# Patient Record
Sex: Male | Born: 1965 | Race: White | Hispanic: Yes | Marital: Single | Smoking: Never smoker
Health system: Southern US, Community
[De-identification: ages and names within clinical notes are randomized; demographics above are authoritative.]

## PROBLEM LIST (undated history)

## (undated) DIAGNOSIS — K219 Gastro-esophageal reflux disease without esophagitis: Secondary | ICD-10-CM

---

## 2003-08-22 ENCOUNTER — Other Ambulatory Visit: Payer: Self-pay

## 2003-10-05 ENCOUNTER — Other Ambulatory Visit: Payer: Self-pay

## 2005-07-18 ENCOUNTER — Emergency Department: Payer: Self-pay | Admitting: Emergency Medicine

## 2005-09-16 ENCOUNTER — Emergency Department: Payer: Self-pay | Admitting: Emergency Medicine

## 2008-10-05 ENCOUNTER — Emergency Department: Payer: Self-pay | Admitting: Emergency Medicine

## 2008-10-18 ENCOUNTER — Emergency Department: Payer: Self-pay | Admitting: Emergency Medicine

## 2011-05-14 ENCOUNTER — Emergency Department: Payer: Self-pay | Admitting: Unknown Physician Specialty

## 2011-10-04 ENCOUNTER — Emergency Department: Payer: Self-pay | Admitting: Emergency Medicine

## 2011-10-04 LAB — URINALYSIS, COMPLETE
Bacteria: NONE SEEN
Bilirubin,UR: NEGATIVE
Blood: NEGATIVE
Glucose,UR: NEGATIVE mg/dL (ref 0–75)
Ketone: NEGATIVE
Nitrite: NEGATIVE
Ph: 6 (ref 4.5–8.0)
Protein: NEGATIVE
RBC,UR: <1 /HPF (ref 0–5)
Specific Gravity: 1.008 (ref 1.003–1.030)
Squamous Epithelial: <1
WBC UR: 3 /HPF (ref 0–5)
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
## 2013-07-08 ENCOUNTER — Emergency Department: Payer: Self-pay | Admitting: Emergency Medicine

## 2015-12-26 ENCOUNTER — Emergency Department (HOSPITAL_COMMUNITY)
Admission: EM | Admit: 2015-12-26 | Discharge: 2015-12-26 | Disposition: A | Discharge status: Home / Self Care | Payer: Self-pay | Attending: Emergency Medicine | Admitting: Emergency Medicine

## 2015-12-26 ENCOUNTER — Emergency Department (HOSPITAL_COMMUNITY): Payer: Self-pay

## 2015-12-26 ENCOUNTER — Encounter (HOSPITAL_COMMUNITY): Payer: Self-pay | Admitting: Emergency Medicine

## 2015-12-26 DIAGNOSIS — K2920 Alcoholic gastritis without bleeding: Secondary | ICD-10-CM | POA: Insufficient documentation

## 2015-12-26 DIAGNOSIS — Z79899 Other long term (current) drug therapy: Secondary | ICD-10-CM | POA: Insufficient documentation

## 2015-12-26 DIAGNOSIS — K920 Hematemesis: Secondary | ICD-10-CM | POA: Insufficient documentation

## 2015-12-26 LAB — CBC WITH DIFFERENTIAL/PLATELET
BASOS ABS: 0 10*3/uL (ref 0.0–0.1)
BASOS PCT: 0 %
EOS ABS: 0.2 10*3/uL (ref 0.0–0.7)
EOS PCT: 3 %
HCT: 37.5 % — ABNORMAL LOW (ref 39.0–52.0)
Hemoglobin: 12.6 g/dL — ABNORMAL LOW (ref 13.0–17.0)
Lymphocytes Relative: 39 %
Lymphs Abs: 2.3 10*3/uL (ref 0.7–4.0)
MCH: 29.7 pg (ref 26.0–34.0)
MCHC: 33.6 g/dL (ref 30.0–36.0)
MCV: 88.4 fL (ref 78.0–100.0)
MONO ABS: 0.2 10*3/uL (ref 0.1–1.0)
MONOS PCT: 3 %
NEUTROS ABS: 3.3 10*3/uL (ref 1.7–7.7)
Neutrophils Relative %: 55 %
PLATELETS: 216 10*3/uL (ref 150–400)
RBC: 4.24 MIL/uL (ref 4.22–5.81)
RDW: 13.2 % (ref 11.5–15.5)
WBC: 5.9 10*3/uL (ref 4.0–10.5)

## 2015-12-26 LAB — COMPREHENSIVE METABOLIC PANEL
ALBUMIN: 3.6 g/dL (ref 3.5–5.0)
ALT: 17 U/L (ref 17–63)
ANION GAP: 9 (ref 5–15)
AST: 21 U/L (ref 15–41)
Alkaline Phosphatase: 93 U/L (ref 38–126)
BILIRUBIN TOTAL: 0.4 mg/dL (ref 0.3–1.2)
BUN: 8 mg/dL (ref 6–20)
CHLORIDE: 113 mmol/L — AB (ref 101–111)
CO2: 20 mmol/L — ABNORMAL LOW (ref 22–32)
Calcium: 8.4 mg/dL — ABNORMAL LOW (ref 8.9–10.3)
Creatinine, Ser: 0.83 mg/dL (ref 0.61–1.24)
GFR calc Af Amer: >60 mL/min (ref >60)
GFR calc non Af Amer: >60 mL/min (ref >60)
GLUCOSE: 103 mg/dL — AB (ref 65–99)
POTASSIUM: 3.7 mmol/L (ref 3.5–5.1)
SODIUM: 142 mmol/L (ref 135–145)
TOTAL PROTEIN: 6.4 g/dL — AB (ref 6.5–8.1)

## 2015-12-26 LAB — LIPASE, BLOOD: Lipase: 25 U/L (ref 11–51)

## 2015-12-26 MED ORDER — SUCRALFATE 1 G PO TABS: 1.0000 g | ORAL_TABLET | Freq: Three times a day (TID) | ORAL | 0 refills | Status: AC

## 2015-12-26 MED ORDER — GI COCKTAIL ~~LOC~~
30.0000 mL | Freq: Once | ORAL | Status: AC
  Administered 2015-12-26: 30 mL via ORAL
  Filled 2015-12-26: qty 30

## 2015-12-26 MED ORDER — OMEPRAZOLE 20 MG PO CPDR: 20.0000 mg | DELAYED_RELEASE_CAPSULE | Freq: Every day | ORAL | 0 refills | Status: AC

## 2015-12-26 NOTE — ED Triage Notes (Signed)
Pt arrives via EMS from home with substernal chest pain that began approx before calling 911. Pt reports 1 episode of yellow emesis possible blood noted. Pt has been drinking today, admits to drinking 24 coronas over the last 24hours. States no PCP. Unknown medical hx. CBG 101. EKG NSR in transport. PT awake, alert, oriented x3, disoriented to time. VSS.

## 2015-12-26 NOTE — Discharge Instructions (Signed)
You were seen and evaluated today after vomiting blood. This is likely secondary to irritation of the lining of your stomach secondary to heavy alcohol drinking.  Please, follow up with a doctor outpatient.  Return for sudden uncontrolled vomiting or vomiting large quantities of blood.

## 2015-12-26 NOTE — ED Notes (Signed)
Spanish video interpreter used for triage and initial assessment. [employer] interpreter Zaida ID [phone]

## 2015-12-26 NOTE — ED Notes (Signed)
Discharge instructions reviewed with patient by Spanish interpreter Sherilyn Cooter ID# [id number]

## 2015-12-26 NOTE — ED Notes (Signed)
Pt passed PO challenge test.  He was able to eat crackers and drink without any trouble.

## 2015-12-26 NOTE — ED Provider Notes (Signed)
[facility]-EMERGENCY DEPT Provider Note   CSN: [id number] Arrival date & time: 12/26/15  1478  First Provider Contact:  First MD Initiated Contact with Patient 12/26/15 1914        History   Chief Complaint Chief Complaint  Patient presents with  . Chest Pain    HPI Larry Woods is a 50 y.o. male.  50 year old male with history of alcoholism presents for vomiting. The patient states that he's drank about 24 coronal is in the last 24 hours. Reports drinking 12 Corona's today. He says that he had an episode of vomiting that had a small amount of blood he believes in it. He denies this ever happening before. Reports some pain in his upper abdomen. Only had one episode of vomiting with blood in it. He reports that the blood is what made him call EMS.  History was taken using translator system Musician name:Julian).      History reviewed. No pertinent past medical history.  There are no active problems to display for this patient.   History reviewed. No pertinent surgical history.     Home Medications    Prior to Admission medications   Medication Sig Start Date End Date Taking? Authorizing Provider  omeprazole (PRILOSEC) 20 MG capsule Take 1 capsule (20 mg total) by mouth daily. 12/26/15   Leta Baptist, MD  sucralfate (CARAFATE) 1 g tablet Take 1 tablet (1 g total) by mouth 4 (four) times daily -  with meals and at bedtime. 12/26/15   Leta Baptist, MD    Family History History reviewed. No pertinent family history.  Social History Social History  Substance Use Topics  . Smoking status: Never Smoker  . Smokeless tobacco: Never Used  . Alcohol use 14.4 oz/week    24 Cans of beer per week     Comment: every other day     Allergies   Review of patient's allergies indicates no known allergies.   Review of Systems Review of Systems  Constitutional: Negative for appetite change, diaphoresis, fatigue and fever.  HENT: Negative for congestion and postnasal drip.    Eyes: Negative for visual disturbance.  Respiratory: Negative for cough, chest tightness and shortness of breath.   Cardiovascular: Negative for chest pain.  Gastrointestinal: Positive for abdominal pain, nausea and vomiting. Negative for blood in stool.  Genitourinary: Negative for dysuria, frequency and urgency.  Musculoskeletal: Negative for back pain and myalgias.  Skin: Negative for rash.  Neurological: Negative for dizziness, weakness and headaches.  Hematological: Does not bruise/bleed easily.     Physical Exam Updated Vital Signs BP 103/71 (BP Location: Right Arm)   Pulse 80   Temp 98.3 F (36.8 C) (Oral)   Resp 18   SpO2 93%   Physical Exam  Constitutional: He is oriented to person, place, and time. He appears well-developed and well-nourished. No distress.  HENT:  Head: Normocephalic and atraumatic.  Right Ear: External ear normal.  Left Ear: External ear normal.  Mouth/Throat: Oropharynx is clear and moist. No oropharyngeal exudate.  Eyes: EOM are normal. Pupils are equal, round, and reactive to light.  Neck: Normal range of motion. Neck supple.  Cardiovascular: Normal rate, regular rhythm, normal heart sounds and intact distal pulses.   No murmur heard. Pulmonary/Chest: Effort normal. No respiratory distress. He has no wheezes. He has no rales.  Abdominal: Soft. He exhibits no distension. There is tenderness (mild epigastric tenderness). There is no guarding. No hernia.  Musculoskeletal: He exhibits no edema.  Neurological: He  is alert and oriented to person, place, and time.  Skin: Skin is warm and dry. Capillary refill takes less than 2 seconds. No rash noted. He is not diaphoretic.  Vitals reviewed.    ED Treatments / Results  Labs (all labs ordered are listed, but only abnormal results are displayed) Labs Reviewed  CBC WITH DIFFERENTIAL/PLATELET - Abnormal; Notable for the following:       Result Value   Hemoglobin 12.6 (*)    HCT 37.5 (*)    All  other components within normal limits  COMPREHENSIVE METABOLIC PANEL - Abnormal; Notable for the following:    Chloride 113 (*)    CO2 20 (*)    Glucose, Bld 103 (*)    Calcium 8.4 (*)    Total Protein 6.4 (*)    All other components within normal limits  LIPASE, BLOOD  ETHANOL    EKG  EKG Interpretation  Date/Time:  Sunday December 26 2015 18:56:55 EDT Ventricular Rate:  85 PR Interval:    QRS Duration: 87 QT Interval:  346 QTC Calculation: 412 R Axis:   56 Text Interpretation:  Sinus rhythm No significant change since last tracing Confirmed by NGUYEN, EMILY ([id number]) on 12/26/2015 8:27:05 PM       Radiology Dg Abd Acute W/chest  Result Date: 12/26/2015 CLINICAL DATA:  Patient with abdominal pain for 1 hour. EXAM: DG ABDOMEN ACUTE W/ 1V CHEST COMPARISON:  Right shoulder radiograph 10/05/2008 FINDINGS: Low lung volumes. Normal cardiac and mediastinal contours. Basilar heterogeneous opacities. No pleural effusion or pneumothorax. Large amount of stool within the cecum and ascending colon. No free intraperitoneal air. Gas within nondilated loops of large and small bowel. Postsurgical clips overlie the left hip. Regional skeleton is unremarkable. IMPRESSION: Large amount of stool within the cecum and ascending colon. Nonobstructed bowel gas pattern. Basilar atelectasis.  Low lung volumes. Electronically Signed   By: Annia Belt M.D.   On: 12/26/2015 20:32   Procedures Procedures (including critical care time)  Medications Ordered in ED Medications  gi cocktail (Maalox,Lidocaine,Donnatal) (30 mLs Oral Given 12/26/15 1954)     Initial Impression / Assessment and Plan / ED Course  I have reviewed the triage vital signs and the nursing notes.  Pertinent labs & imaging results that were available during my care of the patient were reviewed by me and considered in my medical decision making (see chart for details).  Clinical Course    Patient was seen and evaluated in stable  condition. No episodes of vomiting or hematemesis in the emergency department. Mild epigastric tenderness on examination. Patient was given a GI cocktail and reported improvement in his symptoms and resolution of his pain. He tolerated orals without difficulty. Laboratory studies were unremarkable. EKG unremarkable. Acute abdominal series unremarkable. Patient ambulated in hallway without difficulty to and from the bathroom. He was speaking coherently. Does not appear that patient has an emergent issue needing emergent attention at this time. Likely his symptoms are related to an alcoholic gastritis. Patient discharged home in stable condition with prescriptions for omeprazole and Carafate and instruction to follow-up outpatient. Strict return precautions given.  Final Clinical Impressions(s) / ED Diagnoses   Final diagnoses:  Alcoholic gastritis  Hematemesis with nausea    New Prescriptions New Prescriptions   OMEPRAZOLE (PRILOSEC) 20 MG CAPSULE    Take 1 capsule (20 mg total) by mouth daily.   SUCRALFATE (CARAFATE) 1 G TABLET    Take 1 tablet (1 g total) by mouth 4 (four) times daily -  with meals and at bedtime.     Leta Baptist, MD 12/26/15 2128

## 2017-02-08 ENCOUNTER — Encounter (HOSPITAL_COMMUNITY): Payer: Self-pay | Admitting: Emergency Medicine

## 2017-02-08 ENCOUNTER — Emergency Department (HOSPITAL_COMMUNITY)
Admission: EM | Admit: 2017-02-08 | Discharge: 2017-02-08 | Disposition: A | Discharge status: Home / Self Care | Payer: Self-pay | Attending: Emergency Medicine | Admitting: Emergency Medicine

## 2017-02-08 DIAGNOSIS — M791 Myalgia: Secondary | ICD-10-CM | POA: Insufficient documentation

## 2017-02-08 DIAGNOSIS — W5659XA Other contact with other fish, initial encounter: Secondary | ICD-10-CM | POA: Insufficient documentation

## 2017-02-08 DIAGNOSIS — Y9389 Activity, other specified: Secondary | ICD-10-CM | POA: Insufficient documentation

## 2017-02-08 DIAGNOSIS — Z1812 Retained nonmagnetic metal fragments: Secondary | ICD-10-CM | POA: Insufficient documentation

## 2017-02-08 DIAGNOSIS — Y929 Unspecified place or not applicable: Secondary | ICD-10-CM | POA: Insufficient documentation

## 2017-02-08 DIAGNOSIS — Z79899 Other long term (current) drug therapy: Secondary | ICD-10-CM | POA: Insufficient documentation

## 2017-02-08 DIAGNOSIS — Y998 Other external cause status: Secondary | ICD-10-CM | POA: Insufficient documentation

## 2017-02-08 DIAGNOSIS — S6991XA Unspecified injury of right wrist, hand and finger(s), initial encounter: Secondary | ICD-10-CM | POA: Insufficient documentation

## 2017-02-08 DIAGNOSIS — Z23 Encounter for immunization: Secondary | ICD-10-CM | POA: Insufficient documentation

## 2017-02-08 HISTORY — DX: Gastro-esophageal reflux disease without esophagitis: K21.9

## 2017-02-08 MED ORDER — AMOXICILLIN-POT CLAVULANATE 875-125 MG PO TABS: 1.0000 | ORAL_TABLET | Freq: Two times a day (BID) | ORAL | 0 refills | Status: AC

## 2017-02-08 MED ORDER — BACITRACIN ZINC 500 UNIT/GM EX OINT
TOPICAL_OINTMENT | Freq: Once | CUTANEOUS | Status: AC
  Administered 2017-02-08: 1 via TOPICAL
  Filled 2017-02-08: qty 0.9

## 2017-02-08 MED ORDER — LIDOCAINE HCL (PF) 1 % IJ SOLN
10.0000 mL | Freq: Once | INTRAMUSCULAR | Status: AC
  Administered 2017-02-08: 10 mL
  Filled 2017-02-08: qty 10

## 2017-02-08 MED ORDER — TETANUS-DIPHTH-ACELL PERTUSSIS 5-2.5-18.5 LF-MCG/0.5 IM SUSP
0.5000 mL | Freq: Once | INTRAMUSCULAR | Status: AC
  Administered 2017-02-08: 0.5 mL via INTRAMUSCULAR
  Filled 2017-02-08: qty 0.5

## 2017-02-08 NOTE — ED Provider Notes (Signed)
[facility]-EMERGENCY DEPT Provider Note   CSN: [id number] Arrival date & time: 02/08/17  1935     History   Chief Complaint Chief Complaint  Patient presents with  . Foreign Body    HPI Larry Woods is a 51 y.o. male presenting to the ED with acute onset of constant right little finger pain after getting a fish hook stuck in it PTA. Pt states he caught a fish, and the hook swung up and caught his finger. No medications tried PTA. No N/T, dec ROM or hx of immunocompromise. Tetanus is not up to date.   The history is provided by the patient. The history is limited by a language barrier. A language interpreter was used.    Past Medical History:  Diagnosis Date  . Acid reflux     There are no active problems to display for this patient.   History reviewed. No pertinent surgical history.     Home Medications    Prior to Admission medications   Medication Sig Start Date End Date Taking? Authorizing Provider  amoxicillin-clavulanate (AUGMENTIN) 875-125 MG tablet Take 1 tablet by mouth every 12 (twelve) hours. 02/08/17   Russo, [place] N, PA-C  omeprazole (PRILOSEC) 20 MG capsule Take 1 capsule (20 mg total) by mouth daily. 12/26/15   Leta BaptistNguyen, Emily Roe, MD  sucralfate (CARAFATE) 1 g tablet Take 1 tablet (1 g total) by mouth 4 (four) times daily -  with meals and at bedtime. 12/26/15   Leta BaptistNguyen, Emily Roe, MD    Family History No family history on file.  Social History Social History  Substance Use Topics  . Smoking status: Never Smoker  . Smokeless tobacco: Never Used  . Alcohol use 14.4 oz/week    24 Cans of beer per week     Comment: every other day     Allergies   Patient has no known allergies.   Review of Systems Review of Systems  Musculoskeletal: Positive for myalgias.  Skin: Positive for wound.       Foreign body right little finger  Neurological: Negative for numbness.     Physical Exam Updated Vital Signs BP 116/75 (BP Location: Right Arm)   Pulse 74    Temp 98.5 F (36.9 C) (Oral)   Resp 16   Ht 5\' 4"  (1.626 m)   Wt 70.8 kg (156 lb)   SpO2 99%   BMI 26.78 kg/m   Physical Exam  Constitutional: He appears well-developed and well-nourished. No distress.  HENT:  Head: Normocephalic and atraumatic.  Eyes: Conjunctivae are normal.  Cardiovascular: Normal rate and intact distal pulses.   Pulmonary/Chest: Effort normal.  Musculoskeletal:  R 5th finger with fish hook embedded in palmar aspect. Finger with nl active ROM.   Psychiatric: He has a normal mood and affect. His behavior is normal.  Nursing note and vitals reviewed.    ED Treatments / Results  Labs (all labs ordered are listed, but only abnormal results are displayed) Labs Reviewed - No data to display  EKG  EKG Interpretation None       Radiology No results found.  Procedures .Foreign Body Removal Date/Time: 02/08/2017 10:13 PM Performed by: RUSSO, [place]JORDAN N Authorized by: RUSSO, [place]ORDAN N  Consent: Verbal consent obtained. Risks and benefits: risks, benefits and alternatives were discussed Consent given by: patient Patient understanding: patient states understanding of the procedure being performed Imaging studies: imaging studies available Required items: required blood products, implants, devices, and special equipment available Patient identity confirmed: verbally with  patient Body area: skin General location: upper extremity Location details: right small finger Anesthesia: digital block  Anesthesia: Local Anesthetic: lidocaine 1% without epinephrine Anesthetic total: 3 mL Depth: subcutaneous Complexity: simple 1 objects recovered. Objects recovered: fish hook Post-procedure assessment: foreign body removed Patient tolerance: Patient tolerated the procedure well with no immediate complications Comments: Fish hook inspected after removal and is intact.   (including critical care time)  Medications Ordered in ED Medications  bacitracin  ointment (not administered)  lidocaine (PF) (XYLOCAINE) 1 % injection 10 mL (10 mLs Infiltration Given 02/08/17 2115)  Tdap (BOOSTRIX) injection 0.5 mL (0.5 mLs Intramuscular Given 02/08/17 2113)     Initial Impression / Assessment and Plan / ED Course  I have reviewed the triage vital signs and the nursing notes.  Pertinent labs & imaging results that were available during my care of the patient were reviewed by me and considered in my medical decision making (see chart for details).     Pt with fish hook in R 5th finger pad. NV intact. Full AROM after removal. Tetanus updated. Wound copiously irrigated. Bacitracin applied and wound dressed. Will send with Augmentin and PCP follow up in 3 days. Pt is safe for discharge.  Discussed results, findings, treatment and follow up. Patient advised of return precautions. Patient verbalized understanding and agreed with plan.   Final Clinical Impressions(s) / ED Diagnoses   Final diagnoses:  Fish hook injury of finger of right hand, initial encounter    New Prescriptions New Prescriptions   AMOXICILLIN-CLAVULANATE (AUGMENTIN) 875-125 MG TABLET    Take 1 tablet by mouth every 12 (twelve) hours.     Russo, [place] N, PA-C 02/08/17 2216    Mancel Bale, MD 02/13/17 [phone]

## 2017-02-08 NOTE — ED Triage Notes (Signed)
Pt has fishing lure stuck in the right pinky finger x 30 minutes.

## 2017-02-08 NOTE — Discharge Instructions (Signed)
Please read instructions below. Begin taking the antibiotic, Augmentin, 2 times per day until it is gone. Keep your finger clean, washing with soap and water. Apply a clean and dry dressing every day. Watch for signs of infection, including pus draining from the wound, increased redness or pain surrounding the wound. Return to the ER if you see any the symptoms, or develop fever.  Follow up with your primary care provider in 3 days for wound recheck.  Por favor, lea las instrucciones a continuacin. Comience a tomar el antibitico, Augmentin, 2 veces por da hasta que desaparezca. Mantenga su dedo limpio, lavndolo con agua y [place]. Aplique un apsito limpio y [employer] todos los das. Est atento a los signos de infeccin, incluido el drenaje de pus de la herida, aumento del enrojecimiento o dolor alrededor de la herida. Regrese a la sala de emergencias si ve alguno de los sntomas o desarrolla fiebre. Haga un seguimiento con su proveedor de Marine scientistatencin primaria en 3 das para volver a IT consultantverificar la herida.

## 2022-11-05 ENCOUNTER — Emergency Department (HOSPITAL_COMMUNITY)
Admission: EM | Admit: 2022-11-05 | Discharge: 2022-11-06 | Disposition: A | Discharge status: Home / Self Care | Payer: Self-pay | Attending: Emergency Medicine | Admitting: Emergency Medicine

## 2022-11-05 ENCOUNTER — Other Ambulatory Visit: Payer: Self-pay

## 2022-11-05 ENCOUNTER — Encounter (HOSPITAL_COMMUNITY): Payer: Self-pay

## 2022-11-05 ENCOUNTER — Emergency Department (HOSPITAL_COMMUNITY): Payer: Self-pay

## 2022-11-05 DIAGNOSIS — Y908 Blood alcohol level of 240 mg/100 ml or more: Secondary | ICD-10-CM | POA: Insufficient documentation

## 2022-11-05 DIAGNOSIS — R079 Chest pain, unspecified: Secondary | ICD-10-CM | POA: Insufficient documentation

## 2022-11-05 DIAGNOSIS — F10920 Alcohol use, unspecified with intoxication, uncomplicated: Secondary | ICD-10-CM | POA: Insufficient documentation

## 2022-11-05 LAB — CBC
HCT: 39.7 % (ref 39.0–52.0)
Hemoglobin: 13.4 g/dL (ref 13.0–17.0)
MCH: 31.8 pg (ref 26.0–34.0)
MCHC: 33.8 g/dL (ref 30.0–36.0)
MCV: 94.1 fL (ref 80.0–100.0)
Platelets: 196 10*3/uL (ref 150–400)
RBC: 4.22 MIL/uL (ref 4.22–5.81)
RDW: 13.2 % (ref 11.5–15.5)
WBC: 5.5 10*3/uL (ref 4.0–10.5)
nRBC: 0 % (ref 0.0–0.2)

## 2022-11-05 LAB — BASIC METABOLIC PANEL
Anion gap: 14 (ref 5–15)
BUN: 7 mg/dL (ref 6–20)
CO2: 19 mmol/L — ABNORMAL LOW (ref 22–32)
Calcium: 8.2 mg/dL — ABNORMAL LOW (ref 8.9–10.3)
Chloride: 111 mmol/L (ref 98–111)
Creatinine, Ser: 0.76 mg/dL (ref 0.61–1.24)
GFR, Estimated: >60 mL/min (ref >60)
Glucose, Bld: 96 mg/dL (ref 70–99)
Potassium: 3.3 mmol/L — ABNORMAL LOW (ref 3.5–5.1)
Sodium: 144 mmol/L (ref 135–145)

## 2022-11-05 LAB — TROPONIN I (HIGH SENSITIVITY)
Troponin I (High Sensitivity): 4 ng/L (ref <18)
Troponin I (High Sensitivity): 3 ng/L (ref <18)

## 2022-11-05 LAB — ETHANOL: Alcohol, Ethyl (B): 250 mg/dL — ABNORMAL HIGH (ref <10)

## 2022-11-05 MED ORDER — SODIUM CHLORIDE 0.9 % IV BOLUS
1000.0000 mL | Freq: Once | INTRAVENOUS | Status: AC
  Administered 2022-11-05: 1000 mL via INTRAVENOUS

## 2022-11-05 NOTE — ED Provider Notes (Signed)
[place] EMERGENCY DEPARTMENT AT [facility] Provider Note   CSN: [id number] Arrival date & time: 11/05/22  1956     History  Chief Complaint  Patient presents with   Chest Pain    Larry Woods is a 57 y.o. male.  Patient presents to the emergency department complaining of substernal chest pain rated at 2 out of 10 in severity.  Patient states pain began earlier today.  He does state that movement seems to make it worse.  He denies nausea, vomiting, shortness of breath, abdominal pain.  The patient does endorse having alcohol today but does not quantify with a number of drinks.  The patient reportedly showed up on someone's porch after working today and they called 911.  EMS administered 325 mg of aspirin and gave him 1-1/2 L of fluid.  Past medical history significant for acid reflux Spanish language interpreter used throughout encounter  HPI     Home Medications Prior to Admission medications   Medication Sig Start Date End Date Taking? Authorizing Provider  amoxicillin-clavulanate (AUGMENTIN) 875-125 MG tablet Take 1 tablet by mouth every 12 (twelve) hours. 02/08/17   Robinson, [place] N, PA-C  omeprazole (PRILOSEC) 20 MG capsule Take 1 capsule (20 mg total) by mouth daily. 12/26/15   Leta Baptist, MD  sucralfate (CARAFATE) 1 g tablet Take 1 tablet (1 g total) by mouth 4 (four) times daily -  with meals and at bedtime. 12/26/15   Leta Baptist, MD      Allergies    Patient has no known allergies.    Review of Systems   Review of Systems  Physical Exam Updated Vital Signs BP 100/73   Pulse 66   Temp 98.6 F (37 C) (Oral)   Resp 16   Ht 5\' 4"  (1.626 m)   Wt 70.8 kg   SpO2 100%   BMI 26.79 kg/m  Physical Exam Vitals and nursing note reviewed.  Constitutional:      General: He is not in acute distress.    Appearance: He is well-developed.  HENT:     Head: Normocephalic and atraumatic.  Eyes:     Conjunctiva/sclera: Conjunctivae normal.   Cardiovascular:     Rate and Rhythm: Normal rate and regular rhythm.     Heart sounds: No murmur heard. Pulmonary:     Effort: Pulmonary effort is normal. No respiratory distress.     Breath sounds: Normal breath sounds.  Chest:     Chest wall: Tenderness present.     Comments: Mild substernal chest wall tenderness Abdominal:     Palpations: Abdomen is soft.     Tenderness: There is no abdominal tenderness.  Musculoskeletal:        General: No swelling.     Cervical back: Neck supple.  Skin:    General: Skin is warm and dry.     Capillary Refill: Capillary refill takes less than 2 seconds.  Neurological:     Mental Status: He is alert.  Psychiatric:        Mood and Affect: Mood normal.     ED Results / Procedures / Treatments   Labs (all labs ordered are listed, but only abnormal results are displayed) Labs Reviewed  BASIC METABOLIC PANEL - Abnormal; Notable for the following components:      Result Value   Potassium 3.3 (*)    CO2 19 (*)    Calcium 8.2 (*)    All other components within normal limits  ETHANOL -  Abnormal; Notable for the following components:   Alcohol, Ethyl (B) 250 (*)    All other components within normal limits  CBC  TROPONIN I (HIGH SENSITIVITY)  TROPONIN I (HIGH SENSITIVITY)    EKG EKG Interpretation  Date/Time:  Sunday November 05 2022 19:56:52 EDT Ventricular Rate:  68 PR Interval:  135 QRS Duration: 99 QT Interval:  407 QTC Calculation: 433 R Axis:   74 Text Interpretation: Sinus rhythm Normal ECG Confirmed by Eber Hong ([id number]) on 11/05/2022 8:01:18 PM  Radiology DG Chest 2 View  Result Date: 11/05/2022 CLINICAL DATA:  Centralized chest pain, dizziness EXAM: CHEST - 2 VIEW COMPARISON:  12/26/2015 FINDINGS: Frontal and lateral views of the chest demonstrate unremarkable cardiac silhouette. Chronic elevation of the left hemidiaphragm. No acute airspace disease, effusion, or pneumothorax. No acute bony abnormalities. IMPRESSION: 1. No  acute intrathoracic process. Electronically Signed   By: Sharlet Salina M.D.   On: 11/05/2022 20:38    Procedures Procedures    Medications Ordered in ED Medications  sodium chloride 0.9 % bolus 1,000 mL (1,000 mLs Intravenous New Bag/Given 11/05/22 2213)    ED Course/ Medical Decision Making/ A&P                             Medical Decision Making Amount and/or Complexity of Data Reviewed Labs: ordered. Radiology: ordered.   This patient presents to the ED for concern of chest pain, this involves an extensive number of treatment options, and is a complaint that carries with it a high risk of complications and morbidity.  The differential diagnosis includes ACS, PE, musculoskeletal pain, anxiety, others   Co morbidities that complicate the patient evaluation  Alcohol intoxication   Additional history obtained:  Additional history obtained from EMS  Lab Tests:  I Ordered, and personally interpreted labs.  The pertinent results include: Alcohol 250, initial troponin 4, repeat troponin 3, potassium 3.3, unremarkable CBC   Imaging Studies ordered:  I ordered imaging studies including chest x-ray I independently visualized and interpreted imaging which showed no acute findings I agree with the radiologist interpretation   Cardiac Monitoring: / EKG:  The patient was maintained on a cardiac monitor.  I personally viewed and interpreted the cardiac monitored which showed an underlying rhythm of: Sinus rhythm    Problem List / ED Course / Critical interventions / Medication management   I ordered medication including normal saline for fluid resuscitation Reevaluation of the patient after these medicines showed that the patient improved I have reviewed the patients home medicines and have made adjustments as needed   Social Determinants of Health:  Patient has no health insurance, no primary care provider   Test / Admission - Considered:  Patient with negative  troponins x 2, nonischemic EKG, minimal chest pain.  Very low clinical suspicion of ACS or PE.  No shortness of breath.  Patient does have some mild chest tenderness.  Question if he may have had an injury but patient is a poor historian.  Patient is also intoxicated.  Patient is ambulatory, able to safely be discharged at this time.         Final Clinical Impression(s) / ED Diagnoses Final diagnoses:  Chest pain, unspecified type  Alcoholic intoxication without complication [facility])    Rx / DC Orders ED Discharge Orders     None         Pamala Duffel 11/05/22 2346    Eber Hong,  MD 11/06/22 1215

## 2022-11-05 NOTE — Discharge Instructions (Signed)
You were evaluated today for chest pain.  Your workup was reassuring with no signs of acute coronary syndrome or other life-threatening conditions.  Your pain may be musculoskeletal in nature.  Please consider using over-the-counter anti-inflammatories or acetaminophen.  Please follow-up with primary care as needed for further evaluation and management.  If you develop any life-threatening symptoms please return to the emergency department.

## 2022-11-05 NOTE — ED Notes (Signed)
Patient transported to CT 

## 2022-11-05 NOTE — ED Triage Notes (Signed)
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 EKG from ems showed some v1 and v2 elevation.He has been given 1 1/2 liters of fluid  and 1 325 aspirin by ems but no nitro due to him being hypotensive.

## 2024-01-16 ENCOUNTER — Other Ambulatory Visit: Payer: Self-pay

## 2024-01-16 ENCOUNTER — Encounter (HOSPITAL_COMMUNITY): Payer: Self-pay

## 2024-01-16 ENCOUNTER — Emergency Department (HOSPITAL_COMMUNITY)
Admission: EM | Admit: 2024-01-16 | Discharge: 2024-01-17 | Disposition: A | Discharge status: Home / Self Care | Payer: Self-pay | Attending: Emergency Medicine | Admitting: Emergency Medicine

## 2024-01-16 ENCOUNTER — Emergency Department (HOSPITAL_COMMUNITY): Payer: Self-pay

## 2024-01-16 DIAGNOSIS — X501XXA Overexertion from prolonged static or awkward postures, initial encounter: Secondary | ICD-10-CM | POA: Insufficient documentation

## 2024-01-16 DIAGNOSIS — F10129 Alcohol abuse with intoxication, unspecified: Secondary | ICD-10-CM | POA: Insufficient documentation

## 2024-01-16 DIAGNOSIS — M79644 Pain in right finger(s): Secondary | ICD-10-CM | POA: Insufficient documentation

## 2024-01-16 DIAGNOSIS — Y990 Civilian activity done for income or pay: Secondary | ICD-10-CM | POA: Insufficient documentation

## 2024-01-16 DIAGNOSIS — F10920 Alcohol use, unspecified with intoxication, uncomplicated: Secondary | ICD-10-CM

## 2024-01-16 DIAGNOSIS — Y906 Blood alcohol level of 120-199 mg/100 ml: Secondary | ICD-10-CM | POA: Insufficient documentation

## 2024-01-16 LAB — BASIC METABOLIC PANEL WITH GFR
Anion gap: 13 (ref 5–15)
BUN: 11 mg/dL (ref 6–20)
CO2: 19 mmol/L — ABNORMAL LOW (ref 22–32)
Calcium: 8.9 mg/dL (ref 8.9–10.3)
Chloride: 106 mmol/L (ref 98–111)
Creatinine, Ser: 0.67 mg/dL (ref 0.61–1.24)
GFR, Estimated: >60 mL/min (ref >60)
Glucose, Bld: 80 mg/dL (ref 70–99)
Potassium: 3.6 mmol/L (ref 3.5–5.1)
Sodium: 138 mmol/L (ref 135–145)

## 2024-01-16 LAB — CBC
HCT: 41.9 % (ref 39.0–52.0)
Hemoglobin: 14.4 g/dL (ref 13.0–17.0)
MCH: 32.4 pg (ref 26.0–34.0)
MCHC: 34.4 g/dL (ref 30.0–36.0)
MCV: 94.2 fL (ref 80.0–100.0)
Platelets: 235 K/uL (ref 150–400)
RBC: 4.45 MIL/uL (ref 4.22–5.81)
RDW: 12.5 % (ref 11.5–15.5)
WBC: 6.2 K/uL (ref 4.0–10.5)
nRBC: 0 % (ref 0.0–0.2)

## 2024-01-16 LAB — ETHANOL: Alcohol, Ethyl (B): 181 mg/dL — ABNORMAL HIGH (ref <15)
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
## 2024-01-16 NOTE — ED Provider Triage Note (Signed)
 Emergency Medicine Provider Triage Evaluation Note  Larry Woods , a 58 y.o. male  was evaluated in triage.  Pt complains of right hand injury while working.  Patient clinically appears quite intoxicated.  He cannot tell me how he injured it..  Review of Systems  Positive: Hand injury, drunk Negative:   Physical Exam  BP 99/75 (BP Location: Left Arm)   Pulse 72   Temp 98.4 F (36.9 C) (Oral)   Resp 18   SpO2 98%  Gen:   Awake, no distress   Resp:  Normal effort  MSK:   Moves extremities without difficulty  Other:  Right middle finger injury at MCP, some deformity noted, no obvious stepoff  Medical Decision Making  Medically screening exam initiated at 9:32 PM.  Appropriate orders placed.  [facility] was informed that the remainder of the evaluation will be completed by another provider, this initial triage assessment does not replace that evaluation, and the importance of remaining in the ED until their evaluation is complete.  Workup initiated in triage    Larry Woods 01/16/24 2132

## 2024-01-17 MED ORDER — KETOROLAC TROMETHAMINE 15 MG/ML IJ SOLN
15.0000 mg | Freq: Once | INTRAMUSCULAR | Status: AC
  Administered 2024-01-17: 15 mg via INTRAVENOUS
  Filled 2024-01-17: qty 1

## 2024-01-17 MED ORDER — KETOROLAC TROMETHAMINE 10 MG PO TABS: 10.0000 mg | ORAL_TABLET | Freq: Four times a day (QID) | ORAL | 0 refills | Status: AC | PRN

## 2024-01-17 NOTE — ED Provider Notes (Signed)
  EMERGENCY DEPARTMENT AT [facility] Provider Note   CSN: [id number] Arrival date & time: 01/16/24  2123     Patient presents with: Hand Injury and Alcohol Intoxication   Larry Woods is a 58 y.o. male.   Patient with noncontributory past medical history presents today with complaints of right hand pain. Patient extremely difficult historian, reports that this occurred about a week ago when he was at work. He reports that he was using scissors at his job and twisted my finger. Pain is in the base of his right middle finger. Denies any other injuries or complaints.    The history is provided by the patient. No language interpreter was used.  Hand Injury Alcohol Intoxication       Prior to Admission medications   Medication Sig Start Date End Date Taking? Authorizing Provider  amoxicillin -clavulanate (AUGMENTIN ) 875-125 MG tablet Take 1 tablet by mouth every 12 (twelve) hours. 02/08/17   Robinson, [place] N, PA-C  omeprazole  (PRILOSEC) 20 MG capsule Take 1 capsule (20 mg total) by mouth daily. 12/26/15   Nguyen, Emily Roe, MD  sucralfate  (CARAFATE ) 1 g tablet Take 1 tablet (1 g total) by mouth 4 (four) times daily -  with meals and at bedtime. 12/26/15   Nguyen, Emily Roe, MD    Allergies: Patient has no known allergies.    Review of Systems  Musculoskeletal:  Positive for arthralgias.  All other systems reviewed and are negative.   Updated Vital Signs BP 114/79 (BP Location: Right Arm)   Pulse 63   Temp 97.7 F (36.5 C) (Oral)   Resp 16   SpO2 100%   Physical Exam Vitals and nursing note reviewed.  Constitutional:      General: He is not in acute distress.    Appearance: Normal appearance. He is normal weight. He is not ill-appearing, toxic-appearing or diaphoretic.  HENT:     Head: Normocephalic and atraumatic.  Cardiovascular:     Rate and Rhythm: Normal rate.  Pulmonary:     Effort: Pulmonary effort is normal. No respiratory distress.   Musculoskeletal:        General: Normal range of motion.     Cervical back: Normal range of motion.     Comments: Right 3rd finger with tenderness over the MCP. No obvious deformity. Good capillary refill. Good radial pulse. Able to make a fist and fully extend with some discomfort. No swelling, bruising, erythema, or warmth  Skin:    General: Skin is warm and dry.  Neurological:     General: No focal deficit present.     Mental Status: He is alert.  Psychiatric:        Mood and Affect: Mood normal.        Behavior: Behavior normal.     (all labs ordered are listed, but only abnormal results are displayed) Labs Reviewed  BASIC METABOLIC PANEL WITH GFR - Abnormal; Notable for the following components:      Result Value   CO2 19 (*)    All other components within normal limits  ETHANOL - Abnormal; Notable for the following components:   Alcohol, Ethyl (B) 181 (*)    All other components within normal limits  CBC    EKG: None  Radiology: DG Hand Complete Right Result Date: 01/16/2024 CLINICAL DATA:  Hand pain after injury.  Injury at work. EXAM: RIGHT HAND - COMPLETE 3+ VIEW COMPARISON:  None Available. FINDINGS: There is no evidence of fracture or dislocation.  Mild spurring of the thumb interphalangeal joint. There is slight apex radial angulation of the proximal interphalangeal joints of the third and fourth digits with associated osteophytes. No focal soft tissue abnormality. IMPRESSION: 1. No acute fracture or subluxation of the right hand. 2. 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 Electronically Signed   By: Andrea Gasman M.D.   On: 01/16/2024 22:03     Procedures   Medications Ordered in the ED  ketorolac  (TORADOL ) 15 MG/ML injection 15 mg (has no administration in time range)                                    Medical Decision Making Risk Prescription drug management.   Patient presents  today with complaints of right hand pain.  He is afebrile, nontoxic-appearing, and in no acute distress reassuring vital signs.  Physical exam reveals right 3rd finger with tenderness over the MCP. No obvious deformity. Good capillary refill. Good radial pulse. Able to make a fist and fully extend with some discomfort. No swelling, bruising, erythema, or warmth.  According to triage notes, looks like patient was quite intoxicated last night, however given extended wait times, he waited over 12 hours to be seen and now appears clinically sober.  Labs obtained in triage did show an ethanol of 181.  No other acute laboratory abnormalities.  X-ray imaging of his hand has resulted and reveals  1. No acute fracture or subluxation of the right hand. 2. 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   I have personally reviewed and interpreted this imaging and agree with radiology interpretation.   Patient has no clinical signs or symptoms to suggest flexor tenosynovitis or septic arthritis.  No signs of gout.  His hand appears clinically normal.  Patient given Toradol  for pain and will give referral to hand for follow-up as needed. Evaluation and diagnostic testing in the emergency department does not suggest an emergent condition requiring admission or immediate intervention beyond what has been performed at this time.  Plan for discharge with close PCP follow-up.  Patient is understanding and amenable with plan, educated on red flag symptoms that would prompt immediate return.  Patient discharged in stable condition.  Final diagnoses:  Alcoholic intoxication without complication (HCC)  Finger pain, right    ED Discharge Orders          Ordered    ketorolac  (TORADOL ) 10 MG tablet  Every 6 hours PRN        01/17/24 1007          An After Visit Summary was printed and given to the patient.      Larry Woods 01/17/24 1009     Larry Lamar BROCKS, MD 01/19/24 [phone]

## 2024-01-17 NOTE — Discharge Instructions (Addendum)
 As we discussed, your workup in the ER today was reassuring for acute findings.  Laboratory evaluation and x-ray imaging did not reveal any emergent cause of your symptoms.  We did give you pain medicine here and I have given you a prescription for additional pain medicine to go home with.  You can take this as prescribed every day.  You can also take 1000 mg of Tylenol every 6 hours for a max dose of 4000 mg.  I recommend the rest, ice, compress, and elevate your hand as well.  Additionally have given you a referral to a hand specialist a number to call to schedule appointment for follow-up.  Please call to earliest convenience  Return if development of any new or worsening symptoms.  Como comentamos, su evaluacin diagnstica en urgencias hoy fue tranquilizadora, considerando los [employer]. Las pruebas de laboratorio y las radiografas no revelaron ninguna causa emergente de sus sntomas. Le administramos analgsicos y Scientist, product/process development adicionales para que se los lleve a casa. Puede tomarlos segn lo prescrito [employer]. Tambin puede tomar 1000 mg de Tylenol cada 6 horas, con una dosis mxima de 4000 mg. Le recomiendo reposo, hielo, compresas y Microbiologist. Adems, le he derivado a Chief Executive Officer, con un nmero de telfono para programar una cita de seguimiento. Por favor, llame lo antes posible.  Regrese si presenta sntomas nuevos o empeora.
